# Patient Record
Sex: Male | Born: 2000 | Race: White | Hispanic: No | Marital: Single | Smoking: Never smoker
Health system: Southern US, Community
[De-identification: ages and names within clinical notes are randomized; demographics above are authoritative.]

## PROBLEM LIST (undated history)

## (undated) DIAGNOSIS — I10 Essential (primary) hypertension: Secondary | ICD-10-CM

---
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
## 2020-06-27 ENCOUNTER — Other Ambulatory Visit: Payer: Self-pay

## 2020-06-27 ENCOUNTER — Encounter: Payer: Self-pay | Admitting: Emergency Medicine

## 2020-06-27 ENCOUNTER — Emergency Department (INDEPENDENT_AMBULATORY_CARE_PROVIDER_SITE_OTHER)
Admission: EM | Admit: 2020-06-27 | Discharge: 2020-06-27 | Disposition: A | Discharge status: Home / Self Care | Payer: Medicaid Other | Source: Home / Self Care

## 2020-06-27 DIAGNOSIS — R0981 Nasal congestion: Secondary | ICD-10-CM

## 2020-06-27 DIAGNOSIS — Z20822 Contact with and (suspected) exposure to covid-19: Secondary | ICD-10-CM | POA: Diagnosis not present

## 2020-06-27 MED ORDER — FLUTICASONE PROPIONATE 50 MCG/ACT NA SUSP: 2.0000 | Freq: Every day | NASAL | 12 refills | Status: AC

## 2020-06-27 NOTE — ED Triage Notes (Signed)
Nasal congestion since this am- no OTC meds  Lives w/ fiance & family member - also sick NO COVID vaccine

## 2020-06-27 NOTE — ED Provider Notes (Signed)
[facility]    CSN: [id number] Arrival date & time: 06/27/20  1034      History   Chief Complaint Chief Complaint  Patient presents with  . Nasal Congestion    HPI Jesus Mcmillan is a 19 y.o. male.   This is the initial [place] urgent care visit for this 19 year old gentleman who presents with his partner.  Patient is complaining of sinus congestion.  His partner has urinary frequency.  Patient denies any urinary symptoms.  His sinus congestion just began today.  He has no loss of sense of smell, cough, or fever.  He just wants to make sure he does not have Covid.     Past Medical History:  Diagnosis Date  . Hypertension     Patient Active Problem List   Diagnosis Date Noted  . Syncope 07/27/2019  . 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 No pertinent surgical history.     Home Medications    Prior to Admission medications   Medication Sig Start Date End Date Taking? Authorizing Provider  fluticasone (FLONASE) 50 MCG/ACT nasal spray Place 2 sprays into both nostrils daily. 06/27/20   Elvina Sidle, MD    Family History Family History  Problem Relation Age of Onset  . Atrial fibrillation Mother   . Hypertension Mother   . Diabetes Father   . Healthy Sister     Social History Social History   Tobacco Use  . Smoking status: Current Every Day Smoker  . Smokeless tobacco: Never Used  Vaping Use  . Vaping Use: Some days  . Substances: Nicotine, Flavoring  Substance Use Topics  . Alcohol use: Not Currently  . Drug use: Not Currently     Allergies   Pomegranate (punica granatum)   Review of Systems Review of Systems  Constitutional: Negative.   HENT: Positive for congestion.   Respiratory: Negative.   Gastrointestinal: Negative.   Genitourinary: Negative.      Physical Exam Triage Vital Signs ED Triage Vitals  Enc Vitals Group     BP 06/27/20 1106 122/81     Pulse Rate 06/27/20 1106 86     Resp  06/27/20 1106 15     Temp 06/27/20 1106 98.8 F (37.1 C)     Temp Source 06/27/20 1106 Oral     SpO2 06/27/20 1106 100 %     Weight 06/27/20 1110 184 lb (83.5 kg)     Height 06/27/20 1110 5\' 11"  (1.803 m)     Head Circumference --      Peak Flow --      Pain Score 06/27/20 1110 0     Pain Loc --      Pain Edu? --      Excl. in GC? --    No data found.  Updated Vital Signs BP 122/81 (BP Location: Right Arm)   Pulse 86   Temp 98.8 F (37.1 C) (Oral)   Resp 15   Ht 5\' 11"  (1.803 m)   Wt 83.5 kg   SpO2 100%   BMI 25.66 kg/m    Physical Exam Vitals and nursing note reviewed.  Constitutional:      Appearance: Normal appearance. He is normal weight.  HENT:     Nose: Congestion present.     Mouth/Throat:     Mouth: Mucous membranes are moist.     Pharynx: Oropharynx is clear.  Eyes:     Conjunctiva/sclera: Conjunctivae normal.  Cardiovascular:     Rate  and Rhythm: Normal rate.  Pulmonary:     Effort: Pulmonary effort is normal.     Breath sounds: Normal breath sounds.  Musculoskeletal:        General: Normal range of motion.     Cervical back: Normal range of motion and neck supple.  Skin:    General: Skin is warm and dry.  Neurological:     General: No focal deficit present.     Mental Status: He is alert and oriented to person, place, and time.  Psychiatric:        Mood and Affect: Mood normal.        Thought Content: Thought content normal.      UC Treatments / Results  Labs (all labs ordered are listed, but only abnormal results are displayed) Labs Reviewed  COVID-19, FLU A+B AND RSV    EKG   Radiology No results found.  Procedures Procedures (including critical care time)  Medications Ordered in UC Medications - No data to display  Initial Impression / Assessment and Plan / UC Course  I have reviewed the triage vital signs and the nursing notes.  Pertinent labs & imaging results that were available during my care of the patient were  reviewed by me and considered in my medical decision making (see chart for details).    Final Clinical Impressions(s) / UC Diagnoses   Final diagnoses:  Contact with and (suspected) exposure to covid-19  Sinus congestion     Discharge Instructions     The sinus congestion is most likely a virus.  We are running a Covid test and you should stay out of work until Friday.    ED Prescriptions    Medication Sig Dispense Auth. Provider   fluticasone (FLONASE) 50 MCG/ACT nasal spray Place 2 sprays into both nostrils daily. 16 g Elvina Sidle, MD     I have reviewed the PDMP during this encounter.   Elvina Sidle, MD 06/27/20 1127

## 2020-06-27 NOTE — Discharge Instructions (Addendum)
The sinus congestion is most likely a virus.  We are running a Covid test and you should stay out of work until Friday.

## 2020-06-29 LAB — COVID-19, FLU A+B AND RSV
Influenza A, NAA: NOT DETECTED
Influenza B, NAA: NOT DETECTED
RSV, NAA: NOT DETECTED
SARS-CoV-2, NAA: NOT DETECTED

## 2020-07-31 ENCOUNTER — Other Ambulatory Visit: Payer: Self-pay

## 2020-07-31 ENCOUNTER — Emergency Department (INDEPENDENT_AMBULATORY_CARE_PROVIDER_SITE_OTHER)
Admission: EM | Admit: 2020-07-31 | Discharge: 2020-07-31 | Disposition: A | Discharge status: Home / Self Care | Payer: Medicaid Other | Source: Home / Self Care

## 2020-07-31 DIAGNOSIS — R519 Headache, unspecified: Secondary | ICD-10-CM | POA: Diagnosis not present

## 2020-07-31 NOTE — Discharge Instructions (Addendum)
Continue Flonase for sinus congestion

## 2020-07-31 NOTE — ED Triage Notes (Signed)
Patient presents to Urgent Care with complaints of headache and sinus congestion since yesterday. Patient reports he called out of work and would like a note, has not been vaccinated for covid.

## 2020-07-31 NOTE — ED Provider Notes (Signed)
[facility]    CSN: [id number] Arrival date & time: 07/31/20  1924      History   Chief Complaint Chief Complaint  Patient presents with  . Letter for School/Work  . Headache    HPI Jesus Mcmillan is a 19 y.o. male.   Patient complains of some sinus headache.  He did not go to work today due to his symptoms and is requesting a note to be out of work.  He denies any cough of colored sputum.  He was treated for sinus infection about 1 month ago  HPI  Past Medical History:  Diagnosis Date  . Hypertension     Patient Active Problem List   Diagnosis Date Noted  . Syncope 07/27/2019  . 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 No pertinent surgical history.     Home Medications    Prior to Admission medications   Medication Sig Start Date End Date Taking? Authorizing Provider  fluticasone (FLONASE) 50 MCG/ACT nasal spray Place 2 sprays into both nostrils daily. 06/27/20   Elvina Sidle, MD    Family History Family History  Problem Relation Age of Onset  . Atrial fibrillation Mother   . Hypertension Mother   . Diabetes Father   . Healthy Sister     Social History Social History   Tobacco Use  . Smoking status: Current Every Day Smoker  . Smokeless tobacco: Never Used  Vaping Use  . Vaping Use: Some days  . Substances: Nicotine, Flavoring  Substance Use Topics  . Alcohol use: Not Currently  . Drug use: Not Currently     Allergies   Pomegranate (punica granatum)   Review of Systems Review of Systems  HENT: Positive for postnasal drip and sinus pain.   Respiratory: Positive for cough.   All other systems reviewed and are negative.    Physical Exam Triage Vital Signs ED Triage Vitals  Enc Vitals Group     BP 07/31/20 1942 125/81     Pulse Rate 07/31/20 1942 84     Resp 07/31/20 1942 14     Temp 07/31/20 1942 98.3 F (36.8 C)     Temp Source 07/31/20 1942 Oral     SpO2 07/31/20 1942 97 %     Weight --       Height --      Head Circumference --      Peak Flow --      Pain Score 07/31/20 1941 6     Pain Loc --      Pain Edu? --      Excl. in GC? --    No data found.  Updated Vital Signs BP 125/81 (BP Location: Left Arm)   Pulse 84   Temp 98.3 F (36.8 C) (Oral)   Resp 14   SpO2 97%   Visual Acuity Right Eye Distance:   Left Eye Distance:   Bilateral Distance:    Right Eye Near:   Left Eye Near:    Bilateral Near:     Physical Exam Vitals and nursing note reviewed.  Constitutional:      Appearance: He is well-developed.  HENT:     Head: Normocephalic.     Mouth/Throat:     Mouth: Mucous membranes are moist.  Cardiovascular:     Rate and Rhythm: Normal rate and regular rhythm.  Pulmonary:     Effort: Pulmonary effort is normal.     Breath sounds: Normal breath sounds.  Musculoskeletal:  Cervical back: Normal range of motion.  Neurological:     Mental Status: He is alert.  Psychiatric:        Mood and Affect: Mood normal.        Behavior: Behavior normal.      UC Treatments / Results  Labs (all labs ordered are listed, but only abnormal results are displayed) Labs Reviewed - No data to display  EKG   Radiology No results found.  Procedures Procedures (including critical care time)  Medications Ordered in UC Medications - No data to display  Initial Impression / Assessment and Plan / UC Course  I have reviewed the triage vital signs and the nursing notes.  Pertinent labs & imaging results that were available during my care of the patient were reviewed by me and considered in my medical decision making (see chart for details).     Sinus headache Final Clinical Impressions(s) / UC Diagnoses   Final diagnoses:  None   Discharge Instructions   None    ED Prescriptions    None     PDMP not reviewed this encounter.   Frederica Kuster, MD 07/31/20 [phone]

## 2020-08-16 ENCOUNTER — Other Ambulatory Visit: Payer: Self-pay

## 2020-08-16 ENCOUNTER — Emergency Department (INDEPENDENT_AMBULATORY_CARE_PROVIDER_SITE_OTHER)
Admission: EM | Admit: 2020-08-16 | Discharge: 2020-08-16 | Disposition: A | Discharge status: Home / Self Care | Payer: Medicaid Other | Source: Home / Self Care

## 2020-08-16 DIAGNOSIS — B349 Viral infection, unspecified: Secondary | ICD-10-CM

## 2020-08-16 DIAGNOSIS — Z20822 Contact with and (suspected) exposure to covid-19: Secondary | ICD-10-CM | POA: Diagnosis not present

## 2020-08-16 NOTE — ED Triage Notes (Signed)
Pt presents to Urgent Care with c/o fatigue, generalized body aches, fever, cough, and nasal congestion since yesterday. He has had recent COVID exposure; has not been vaccinated.

## 2020-08-16 NOTE — Discharge Instructions (Addendum)
Your COVID 19 results should result within 3-5 days. Negative results are immediately resulted to Mychart. Positive results will receive a follow-up call from our clinic. If symptoms are present, I recommend home quarantine until results are known.  Alternate Tylenol and ibuprofen as needed for body aches and fever.  Over-the-counter antihistamine such as Zyrtec or Xyzal for nasal symptoms.  Symptom management per recommendations discussed today.  If any breathing difficulty or chest pain develops go immediately to the closest emergency department for evaluation.

## 2020-08-16 NOTE — ED Provider Notes (Signed)
[facility]    CSN: [id number] Arrival date & time: 08/16/20  1517      History   Chief Complaint Chief Complaint  Patient presents with  . Fatigue  . Cough  . Generalized Body Aches    HPI Jesus Mcmillan is a 20 y.o. male.   HPI Patient presents with URI symptoms including cough, nasal congestion, fever, fatigue, and bodyaches. Patient endorses known COVID exposure. COVID Vaccinated: N. Denies worrisome symptoms of shortness of breath, weakness, N&V, chest pain. Past Medical History:  Diagnosis Date  . Hypertension     Patient Active Problem List   Diagnosis Date Noted  . Syncope 07/27/2019  . 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 No pertinent surgical history.     Home Medications    Prior to Admission medications   Medication Sig Start Date End Date Taking? Authorizing Provider  Pseudoephedrine-APAP-DM (DAYQUIL MULTI-SYMPTOM COLD/FLU PO) Take 1 tablet by mouth 2 (two) times daily as needed.   Yes [provider]  fluticasone (FLONASE) 50 MCG/ACT nasal spray Place 2 sprays into both nostrils daily. 06/27/20   Elvina Sidle, MD    Family History Family History  Problem Relation Age of Onset  . Atrial fibrillation Mother   . Hypertension Mother   . Diabetes Father   . Healthy Sister     Social History Social History   Tobacco Use  . Smoking status: Never Smoker  . Smokeless tobacco: Never Used  Vaping Use  . Vaping Use: Some days  . Substances: Nicotine, Flavoring  Substance Use Topics  . Alcohol use: Not Currently  . Drug use: Not Currently     Allergies   Pomegranate (punica granatum) Review of Systems Review of Systems Pertinent negatives listed in HPI Physical Exam Triage Vital Signs ED Triage Vitals  Enc Vitals Group     BP 08/16/20 1655 126/82     Pulse Rate 08/16/20 1655 85     Resp 08/16/20 1655 18     Temp 08/16/20 1655 98.3 F (36.8 C)     Temp Source 08/16/20 1655 Oral     SpO2  08/16/20 1655 100 %     Weight 08/16/20 1649 184 lb (83.5 kg)     Height 08/16/20 1649 6' (1.829 m)     Head Circumference --      Peak Flow --      Pain Score 08/16/20 1649 6     Pain Loc --      Pain Edu? --      Excl. in GC? --    No data found.  Updated Vital Signs BP 126/82 (BP Location: Right Arm)   Pulse 85   Temp 98.3 F (36.8 C) (Oral)   Resp 18   Ht 6' (1.829 m)   Wt 184 lb (83.5 kg)   SpO2 100%   BMI 24.95 kg/m   Visual Acuity Right Eye Distance:   Left Eye Distance:   Bilateral Distance:    Right Eye Near:   Left Eye Near:    Bilateral Near:     Physical Exam  General Appearance:    Alert, cooperative, no distress  HENT:   Normocephalic, ears normal, nares mucosal edema with congestion, rhinorrhea, oropharynx    Eyes:    PERRL, conjunctiva/corneas clear, EOM's intact       Lungs:     Clear to auscultation bilaterally, respirations unlabored  Heart:    Regular rate and rhythm  Neurologic:  Awake, alert, oriented x 3. No apparent focal neurological           defect.    UC Treatments / Results  Labs (all labs ordered are listed, but only abnormal results are displayed) Labs Reviewed - No data to display  EKG   Radiology No results found.  Procedures Procedures (including critical care time)  Medications Ordered in UC Medications - No data to display  Initial Impression / Assessment and Plan / UC Course  I have reviewed the triage vital signs and the nursing notes.  Pertinent labs & imaging results that were available during my care of the patient were reviewed by me and considered in my medical decision making (see chart for details).     COVID/Flu test pending. Symptom management warranted only.  Manage fever with Tylenol and ibuprofen.  Nasal symptoms with over-the-counter antihistamines recommended.  Treatment per discharge medications/discharge instructions.  Red flags/ER precautions given. The most current CDC isolation/quarantine  recommendation advised.   Final Clinical Impressions(s) / UC Diagnoses   Final diagnoses:  Close exposure to COVID-19 virus   Discharge Instructions   None    ED Prescriptions    None     PDMP not reviewed this encounter.   Bing Neighbors, FNP 08/16/20 1805

## 2020-08-19 LAB — COVID-19, FLU A+B NAA
Influenza A, NAA: NOT DETECTED
Influenza B, NAA: NOT DETECTED
SARS-CoV-2, NAA: NOT DETECTED

## 2020-10-19 ENCOUNTER — Encounter (HOSPITAL_BASED_OUTPATIENT_CLINIC_OR_DEPARTMENT_OTHER): Payer: Self-pay | Admitting: *Deleted

## 2020-10-19 ENCOUNTER — Emergency Department (HOSPITAL_BASED_OUTPATIENT_CLINIC_OR_DEPARTMENT_OTHER): Payer: Medicaid Other

## 2020-10-19 ENCOUNTER — Emergency Department (HOSPITAL_BASED_OUTPATIENT_CLINIC_OR_DEPARTMENT_OTHER)
Admission: EM | Admit: 2020-10-19 | Discharge: 2020-10-19 | Disposition: A | Discharge status: Home / Self Care | Payer: Medicaid Other | Attending: Emergency Medicine | Admitting: Emergency Medicine

## 2020-10-19 ENCOUNTER — Other Ambulatory Visit: Payer: Self-pay

## 2020-10-19 DIAGNOSIS — M79662 Pain in left lower leg: Secondary | ICD-10-CM | POA: Insufficient documentation

## 2020-10-19 DIAGNOSIS — R0602 Shortness of breath: Secondary | ICD-10-CM | POA: Insufficient documentation

## 2020-10-19 DIAGNOSIS — M79605 Pain in left leg: Secondary | ICD-10-CM

## 2020-10-19 DIAGNOSIS — R2241 Localized swelling, mass and lump, right lower limb: Secondary | ICD-10-CM | POA: Insufficient documentation

## 2020-10-19 DIAGNOSIS — I10 Essential (primary) hypertension: Secondary | ICD-10-CM | POA: Insufficient documentation

## 2020-10-19 DIAGNOSIS — M79651 Pain in right thigh: Secondary | ICD-10-CM | POA: Insufficient documentation

## 2020-10-19 DIAGNOSIS — R0789 Other chest pain: Secondary | ICD-10-CM | POA: Insufficient documentation

## 2020-10-19 LAB — D-DIMER, QUANTITATIVE: D-Dimer, Quant: <0.27 ug/mL-FEU (ref 0.00–0.50)

## 2020-10-19 LAB — COMPREHENSIVE METABOLIC PANEL
ALT: 16 U/L (ref 0–44)
AST: 17 U/L (ref 15–41)
Albumin: 4.8 g/dL (ref 3.5–5.0)
Alkaline Phosphatase: 59 U/L (ref 38–126)
Anion gap: 9 (ref 5–15)
BUN: 11 mg/dL (ref 6–20)
CO2: 29 mmol/L (ref 22–32)
Calcium: 9.4 mg/dL (ref 8.9–10.3)
Chloride: 99 mmol/L (ref 98–111)
Creatinine, Ser: 0.83 mg/dL (ref 0.61–1.24)
GFR, Estimated: >60 mL/min (ref >60)
Glucose, Bld: 101 mg/dL — ABNORMAL HIGH (ref 70–99)
Potassium: 3.7 mmol/L (ref 3.5–5.1)
Sodium: 137 mmol/L (ref 135–145)
Total Bilirubin: 0.8 mg/dL (ref 0.3–1.2)
Total Protein: 7.3 g/dL (ref 6.5–8.1)

## 2020-10-19 LAB — CBC WITH DIFFERENTIAL/PLATELET
Abs Immature Granulocytes: 0.03 10*3/uL (ref 0.00–0.07)
Basophils Absolute: 0 10*3/uL (ref 0.0–0.1)
Basophils Relative: 0 %
Eosinophils Absolute: 0 10*3/uL (ref 0.0–0.5)
Eosinophils Relative: 0 %
HCT: 46.2 % (ref 39.0–52.0)
Hemoglobin: 16.8 g/dL (ref 13.0–17.0)
Immature Granulocytes: 0 %
Lymphocytes Relative: 24 %
Lymphs Abs: 1.7 10*3/uL (ref 0.7–4.0)
MCH: 31.4 pg (ref 26.0–34.0)
MCHC: 36.4 g/dL — ABNORMAL HIGH (ref 30.0–36.0)
MCV: 86.4 fL (ref 80.0–100.0)
Monocytes Absolute: 0.6 10*3/uL (ref 0.1–1.0)
Monocytes Relative: 8 %
Neutro Abs: 4.9 10*3/uL (ref 1.7–7.7)
Neutrophils Relative %: 68 %
Platelets: 257 10*3/uL (ref 150–400)
RBC: 5.35 MIL/uL (ref 4.22–5.81)
RDW: 11.7 % (ref 11.5–15.5)
WBC: 7.3 10*3/uL (ref 4.0–10.5)
nRBC: 0 % (ref 0.0–0.2)

## 2020-10-19 NOTE — ED Triage Notes (Signed)
Woke with soreness and swelling to his left upper leg. Redness and hot to touch. He feels SOB.

## 2020-10-19 NOTE — ED Notes (Signed)
Pt at xray and Ct will get vitals after Pt returns

## 2020-10-19 NOTE — ED Provider Notes (Addendum)
[facility] EMERGENCY DEPARTMENT Provider Note   CSN: [id number] Arrival date & time: 10/19/20  1730     History No chief complaint on file.   Jesus Mcmillan is a 20 y.o. male.  HPI      Jesus Mcmillan is a 20 y.o. male, with a history of HTN, presenting to the ED with lower extremity pain beginning this morning. Patient states he began to feel posterior right upper leg pain and swelling present upon waking this morning.   He endorses intermittent shortness of breath, chest tightness, and palpitations beginning yesterday. He is specifically concerned about a blood clot.  Denies fever/chills, cough, difficulty urinating, urinary symptoms, other abdominal pain, dizziness, syncope, numbness, weakness, back pain, or any other complaints.   Past Medical History:  Diagnosis Date  . Hypertension     Patient Active Problem List   Diagnosis Date Noted  . Syncope 07/27/2019  . 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 No pertinent surgical history.     Family History  Problem Relation Age of Onset  . Atrial fibrillation Mother   . Hypertension Mother   . Diabetes Father   . Healthy Sister     Social History   Tobacco Use  . Smoking status: Never Smoker  . Smokeless tobacco: Never Used  Vaping Use  . Vaping Use: Some days  . Substances: Nicotine, Flavoring  Substance Use Topics  . Alcohol use: Not Currently  . Drug use: Not Currently    Home Medications Prior to Admission medications   Medication Sig Start Date End Date Taking? Authorizing Provider  fluticasone (FLONASE) 50 MCG/ACT nasal spray Place 2 sprays into both nostrils daily. 06/27/20   Elvina Sidle, MD  Pseudoephedrine-APAP-DM (DAYQUIL MULTI-SYMPTOM COLD/FLU PO) Take 1 tablet by mouth 2 (two) times daily as needed.    [provider]    Allergies    Pomegranate (punica granatum)  Review of Systems   Review of Systems  Constitutional: Negative for chills,  diaphoresis and fever.  Respiratory: Positive for shortness of breath. Negative for cough.   Cardiovascular: Positive for chest pain.  Gastrointestinal: Negative for abdominal pain, diarrhea, nausea and vomiting.  Musculoskeletal:       Pain to the left posterior thigh.  Neurological: Negative for dizziness, syncope, weakness and numbness.  All other systems reviewed and are negative.   Physical Exam Updated Vital Signs BP 133/87 (BP Location: Left Arm)   Pulse (!) 106   Temp 98.4 F (36.9 C) (Oral)   Resp 18   Ht 6' (1.829 m)   Wt 86.2 kg   SpO2 100%   BMI 25.77 kg/m   Physical Exam Vitals and nursing note reviewed.  Constitutional:      General: He is not in acute distress.    Appearance: He is well-developed. He is not diaphoretic.  HENT:     Head: Normocephalic and atraumatic.     Mouth/Throat:     Mouth: Mucous membranes are moist.     Pharynx: Oropharynx is clear.  Eyes:     Conjunctiva/sclera: Conjunctivae normal.  Cardiovascular:     Rate and Rhythm: Normal rate and regular rhythm.     Pulses: Normal pulses.          Radial pulses are 2+ on the right side and 2+ on the left side.       Posterior tibial pulses are 2+ on the right side and 2+ on the left side.     Heart  sounds: Normal heart sounds.     Comments: Tactile temperature in the extremities appropriate and equal bilaterally. Not tachycardic on my exam. Pulmonary:     Effort: Pulmonary effort is normal. No respiratory distress.     Breath sounds: Normal breath sounds.  Abdominal:     Palpations: Abdomen is soft.     Tenderness: There is no abdominal tenderness. There is no guarding.  Musculoskeletal:     Cervical back: Neck supple.     Right lower leg: No edema.     Left lower leg: No edema.     Comments: Tenderness to the left hamstring region.  He endorses pain at rest, but worse with movement and palpation.  No erythema, swelling, skin lesions, increased warmth. Full range of motion in the left  knee and hip without noted difficulty. No swelling or pain noted to the left knee or hip. No swelling, pain, tenderness, erythema, increased warmth to the left calf.  Skin:    General: Skin is warm and dry.  Neurological:     Mental Status: He is alert.     Comments: Sensation grossly intact to light touch in the lower extremities bilaterally. No saddle anesthesias. Strength 5/5 in the bilateral lower extremities. No noted gait deficit. Coordination intact.  Psychiatric:        Mood and Affect: Mood and affect normal.        Speech: Speech normal.        Behavior: Behavior normal.     ED Results / Procedures / Treatments   Labs (all labs ordered are listed, but only abnormal results are displayed) Labs Reviewed  COMPREHENSIVE METABOLIC PANEL - Abnormal; Notable for the following components:      Result Value   Glucose, Bld 101 (*)    All other components within normal limits  CBC WITH DIFFERENTIAL/PLATELET - Abnormal; Notable for the following components:   MCHC 36.4 (*)    All other components within normal limits  D-DIMER, QUANTITATIVE    EKG EKG Interpretation  Date/Time:  Thursday October 19 2020 18:39:54 EDT Ventricular Rate:  89 PR Interval:    QRS Duration: 88 QT Interval:  341 QTC Calculation: 415 R Axis:   55 Text Interpretation: Sinus rhythm Confirmed by Vanetta Mulders [phone]) on 10/19/2020 6:44:33 PM   Radiology DG Chest 2 View  Result Date: 10/19/2020 CLINICAL DATA:  Shortness of breath, chest pain. EXAM: CHEST - 2 VIEW COMPARISON:  July 18, 2019. FINDINGS: The heart size and mediastinal contours are within normal limits. Both lungs are clear. No pneumothorax or pleural effusion is noted. The visualized skeletal structures are unremarkable. IMPRESSION: No active cardiopulmonary disease. Electronically Signed   By: Lupita Raider M.D.   On: 10/19/2020 18:52   US Venous Img Lower Unilateral Left  Result Date: 10/19/2020 CLINICAL DATA:  Left leg pain.  EXAM: LEFT LOWER EXTREMITY VENOUS DOPPLER ULTRASOUND TECHNIQUE: Gray-scale sonography with compression, as well as color and duplex ultrasound, were performed to evaluate the deep venous system(s) from the level of the common femoral vein through the popliteal and proximal calf veins. COMPARISON:  None. FINDINGS: VENOUS Normal compressibility of the LEFT common femoral, superficial femoral, and popliteal veins, as well as the visualized LEFT calf veins. Visualized portions of profunda femoral vein and great saphenous vein unremarkable. No filling defects to suggest DVT on grayscale or color Doppler imaging. Doppler waveforms show normal direction of venous flow, normal respiratory plasticity and response to augmentation. Limited views of the contralateral common  femoral vein are unremarkable. OTHER None. Limitations: none IMPRESSION: Negative. Electronically Signed   By: Aram Candela M.D.   On: 10/19/2020 19:22    Procedures Procedures   Medications Ordered in ED Medications - No data to display  ED Course  I have reviewed the triage vital signs and the nursing notes.  Pertinent labs & imaging results that were available during my care of the patient were reviewed by me and considered in my medical decision making (see chart for details).    MDM Rules/Calculators/A&P                          Patient presents primarily complaining of left lower extremity pain beginning this morning. He also complains of shortness of breath and chest discomfort. Patient is nontoxic appearing, afebrile, not tachypneic, not hypotensive, maintains excellent SPO2 on room air, and is in no apparent distress.  Mildly tachycardic at triage, but not tachycardic on my exam.   I have reviewed the patient's chart to obtain more information.   I reviewed and interpreted the patient's labs and radiological studies. Work-up here reassuring.  PCP follow-up for any further assessment or management. The patient was given  instructions for home care as well as return precautions. Patient voices understanding of these instructions, accepts the plan, and is comfortable with discharge.   Vitals:   10/19/20 1733 10/19/20 1735 10/19/20 1924  BP:  133/87 122/78  Pulse:  (!) 106 83  Resp:  18 19  Temp:  98.4 F (36.9 C)   TempSrc:  Oral   SpO2:  100% 100%  Weight: 86.2 kg    Height: 6' (1.829 m)      Final Clinical Impression(s) / ED Diagnoses Final diagnoses:  Pain of left lower extremity    Rx / DC Orders ED Discharge Orders    None       Concepcion Living 10/19/20 1950    Anselm Pancoast, PA-C 10/19/20 1951    Vanetta Mulders, MD 10/25/20 [phone]

## 2020-10-19 NOTE — ED Notes (Signed)
ED Provider at bedside. 

## 2020-10-19 NOTE — ED Notes (Signed)
Patient transported to X-ray 

## 2020-10-19 NOTE — Discharge Instructions (Signed)
Work-up here today was reassuring. Antiinflammatory medications: Take 600 mg of ibuprofen every 6 hours or 440 mg (over the counter dose) to 500 mg (prescription dose) of naproxen every 12 hours for the next 3 days. After this time, these medications may be used as needed for pain. Take these medications with food to avoid upset stomach. Choose only one of these medications, do not take them together. Acetaminophen (generic for Tylenol): Should you continue to have additional pain while taking the ibuprofen or naproxen, you may add in acetaminophen as needed. Your daily total maximum amount of acetaminophen from all sources should be limited to 4000mg /day for persons without liver problems, or 2000mg /day for those with liver problems.  May apply heat or ice to the region.  Follow-up with a primary care provider for any further assessment or management of this issue.  Return to the emergency department for worsening symptoms, chest pain, shortness of breath, dizziness, passing out, or any other major concerns.

## 2021-11-01 IMAGING — US US EXTREM LOW VENOUS*L*
1 series · 14 of 24 positions shown · non-contrast
Comparison: None.

CLINICAL DATA: Left leg pain.

EXAM:
LEFT LOWER EXTREMITY VENOUS DOPPLER ULTRASOUND
TECHNIQUE: Gray-scale sonography with compression, as well as color and duplex
ultrasound, were performed to evaluate the deep venous system(s)
from the level of the common femoral vein through the popliteal and
proximal calf veins.

[Series 1: us extrem low venous*left* · 14 of 45 slices shown]
[im 1/45]
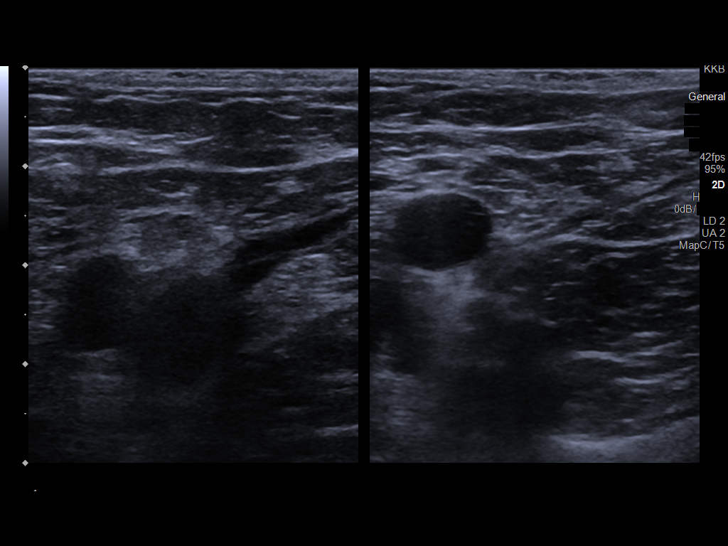
[im 4/45]
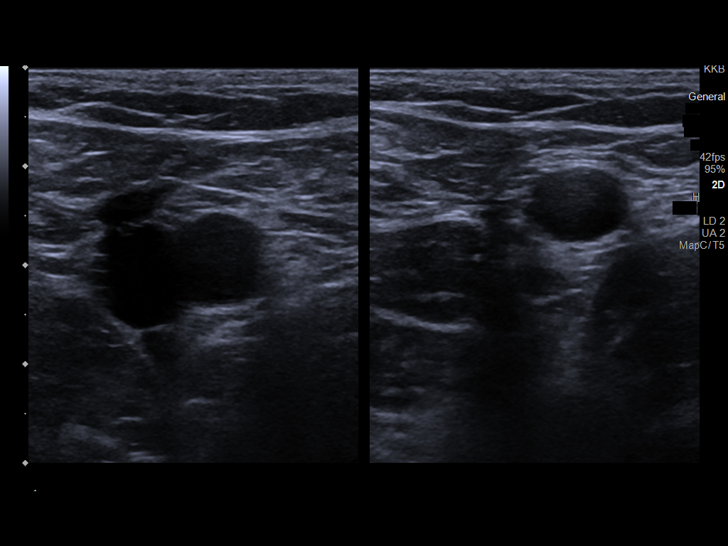
[im 8/45]
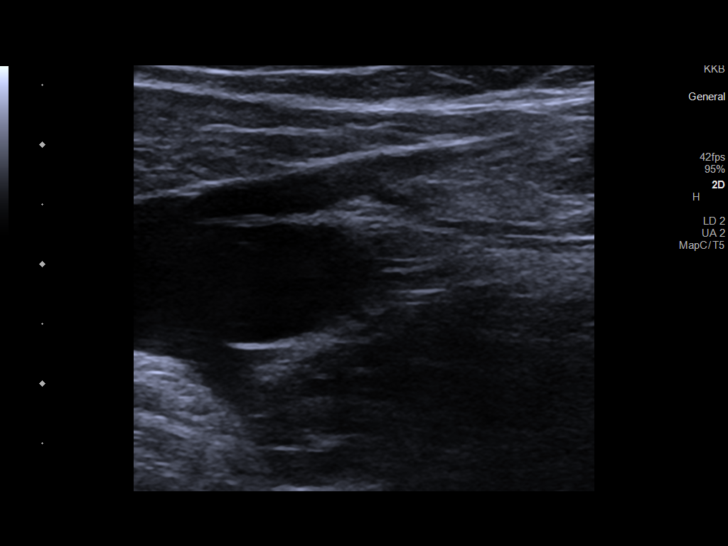
[im 12/45]
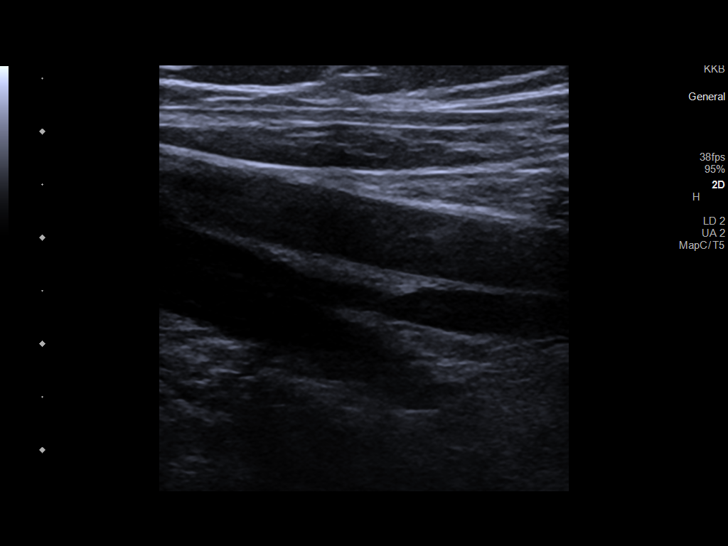
[im 14/45]
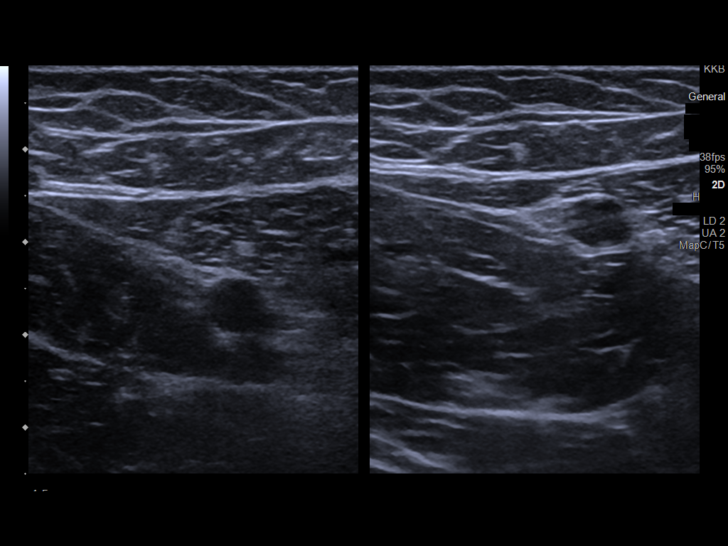
[im 18/45]
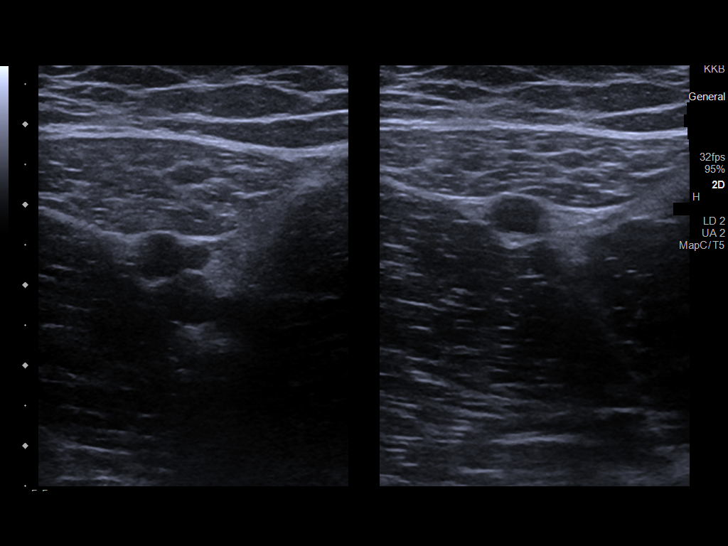
[im 22/45]
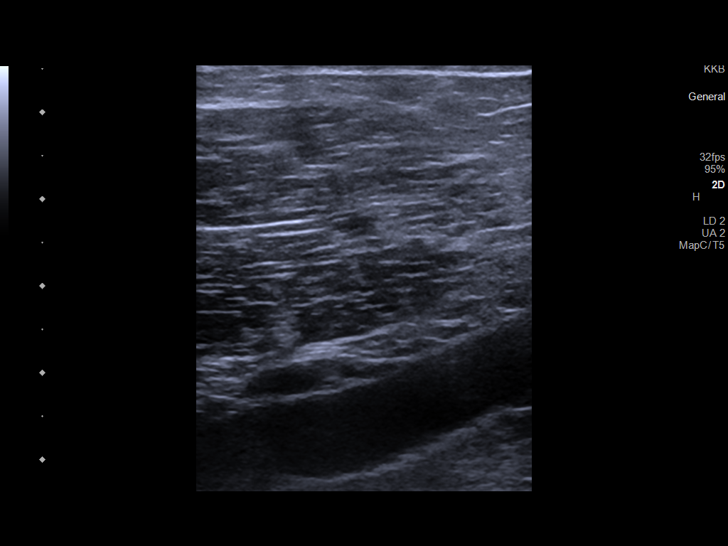
[im 23/45]
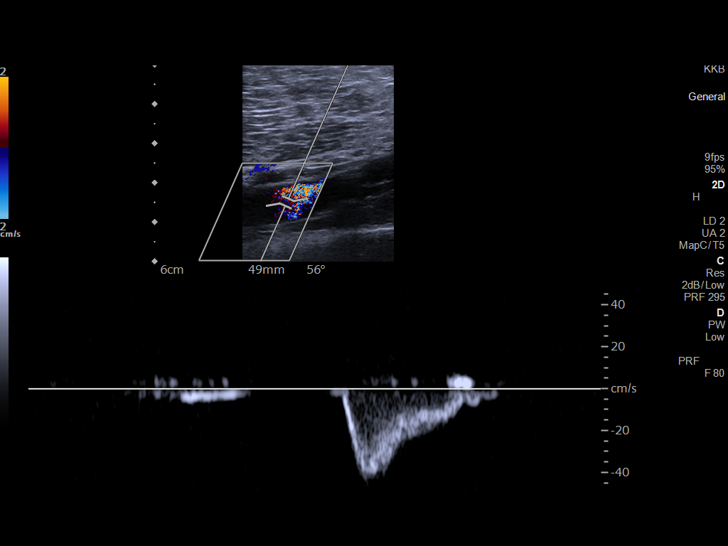
[im 27/45]
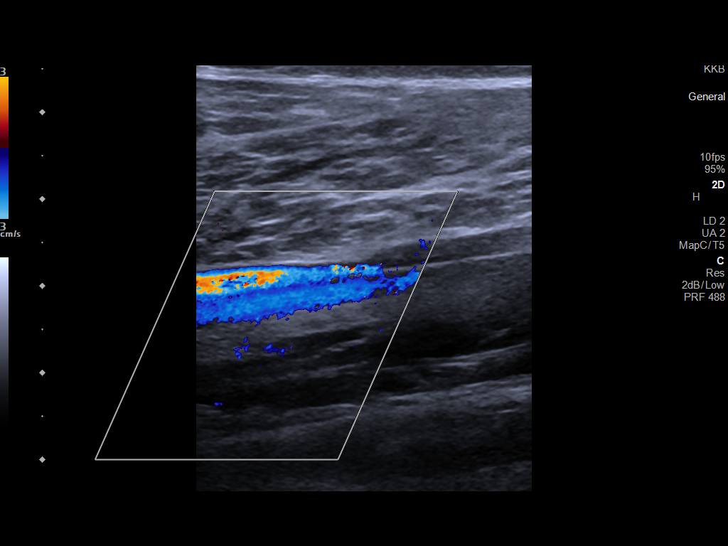
[im 31/45]
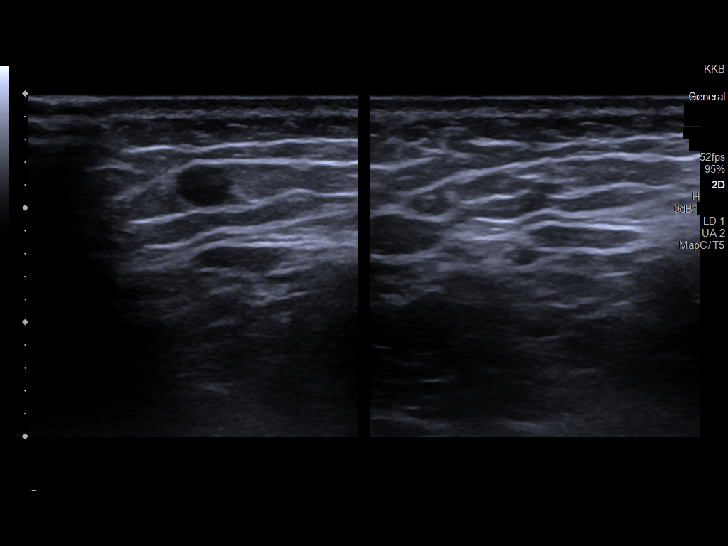
[im 35/45]
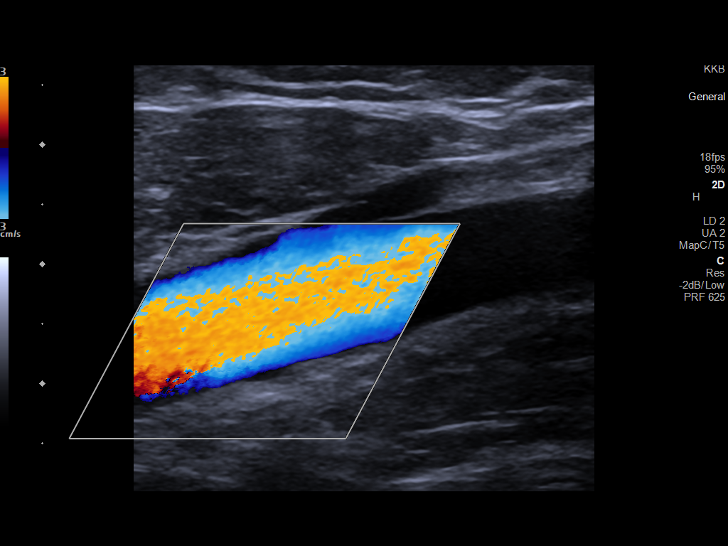
[im 37/45]
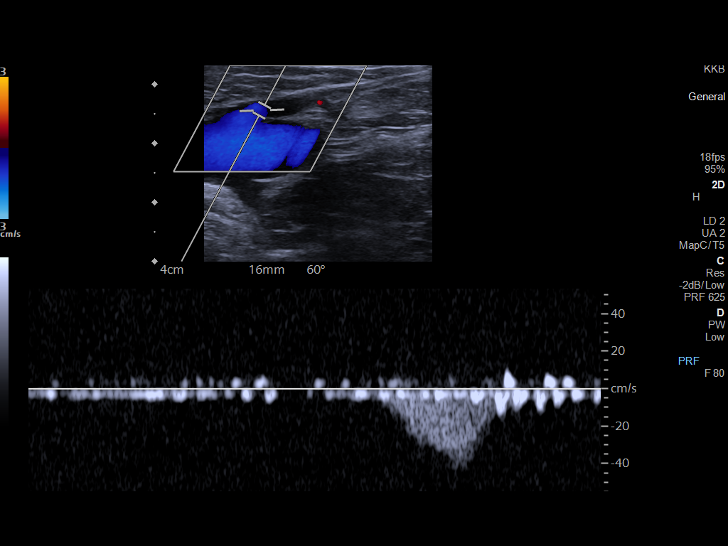
[im 41/45]
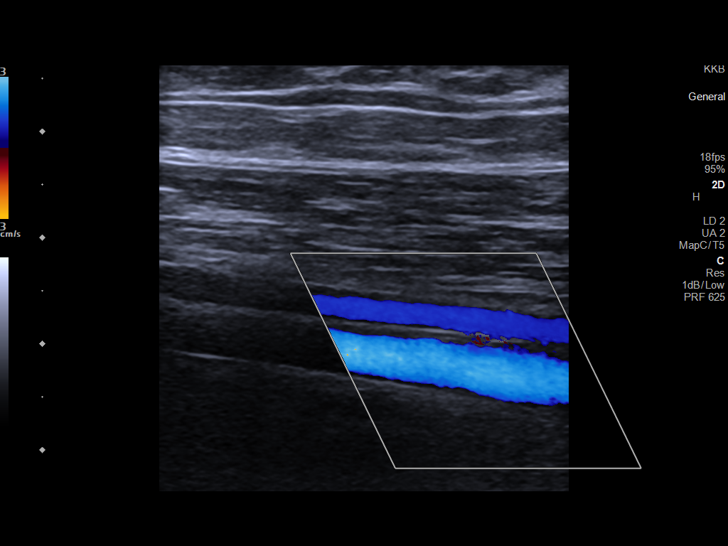
[im 45/45]
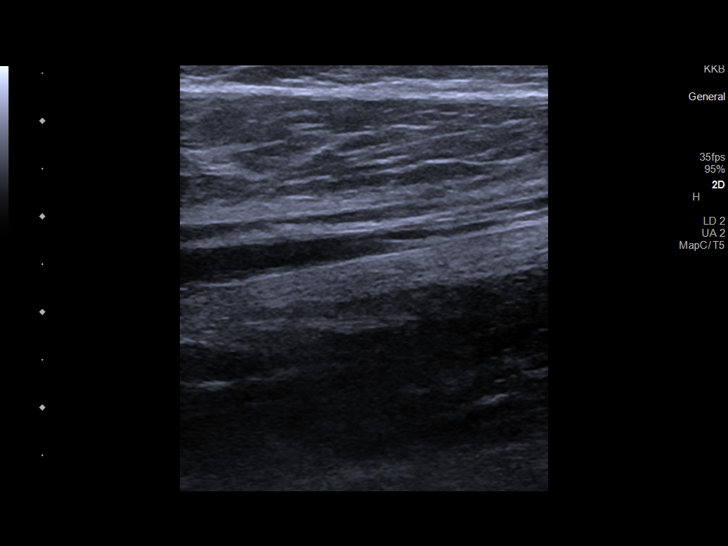

[14 of 24 positions shown; findings below may reference images not displayed]

FINDINGS: VENOUS

Normal compressibility of the LEFT common femoral, superficial
femoral, and popliteal veins, as well as the visualized LEFT calf
veins. Visualized portions of profunda femoral vein and great
saphenous vein unremarkable. No filling defects to suggest DVT on
grayscale or color Doppler imaging. Doppler waveforms show normal
direction of venous flow, normal respiratory plasticity and response
to augmentation.

Limited views of the contralateral common femoral vein are
unremarkable.

OTHER

None.

Limitations: none
IMPRESSION: Negative.
